# Patient Record
Sex: Female | Born: 1950 | Race: White | Hispanic: No | Marital: Married | Smoking: Current every day smoker
Health system: Southern US, Community
[De-identification: ages and names within clinical notes are randomized; demographics above are authoritative.]

## PROBLEM LIST (undated history)

## (undated) DIAGNOSIS — Z860100 Personal history of colon polyps, unspecified: Secondary | ICD-10-CM

## (undated) DIAGNOSIS — I10 Essential (primary) hypertension: Secondary | ICD-10-CM

## (undated) DIAGNOSIS — K219 Gastro-esophageal reflux disease without esophagitis: Secondary | ICD-10-CM

## (undated) HISTORY — PX: COLONOSCOPY W/ BIOPSIES AND POLYPECTOMY: SHX1376

## (undated) HISTORY — PX: BLADDER SUSPENSION: SHX72

## (undated) HISTORY — PX: ABDOMINAL HYSTERECTOMY: SHX81

---

## 2018-04-12 ENCOUNTER — Other Ambulatory Visit: Payer: Self-pay | Admitting: Family Medicine

## 2018-04-12 DIAGNOSIS — Z13820 Encounter for screening for osteoporosis: Secondary | ICD-10-CM

## 2018-05-01 ENCOUNTER — Encounter: Payer: Self-pay | Admitting: Family Medicine

## 2018-05-10 ENCOUNTER — Encounter: Payer: Self-pay | Admitting: *Deleted

## 2018-05-13 ENCOUNTER — Other Ambulatory Visit: Payer: Self-pay

## 2018-05-13 DIAGNOSIS — Z86010 Personal history of colonic polyps: Secondary | ICD-10-CM

## 2018-05-16 ENCOUNTER — Ambulatory Visit
Admission: RE | Admit: 2018-05-16 | Discharge: 2018-05-16 | Disposition: A | Discharge status: Home / Self Care | Payer: Medicare Other | Source: Ambulatory Visit | Attending: Family Medicine | Admitting: Family Medicine

## 2018-05-16 ENCOUNTER — Encounter: Payer: Self-pay | Admitting: Radiology

## 2018-05-16 DIAGNOSIS — M8588 Other specified disorders of bone density and structure, other site: Secondary | ICD-10-CM | POA: Insufficient documentation

## 2018-05-16 DIAGNOSIS — Z13820 Encounter for screening for osteoporosis: Secondary | ICD-10-CM | POA: Diagnosis present

## 2018-05-21 ENCOUNTER — Encounter
Admission: RE | Disposition: A | Discharge status: Home / Self Care | Payer: Self-pay | Source: Ambulatory Visit | Attending: Gastroenterology

## 2018-05-21 ENCOUNTER — Encounter: Payer: Self-pay | Admitting: Anesthesiology

## 2018-05-21 ENCOUNTER — Ambulatory Visit: Payer: Medicare Other | Admitting: Certified Registered Nurse Anesthetist

## 2018-05-21 ENCOUNTER — Ambulatory Visit
Admission: RE | Admit: 2018-05-21 | Discharge: 2018-05-21 | Disposition: A | Discharge status: Home / Self Care | Payer: Medicare Other | Source: Ambulatory Visit | Attending: Gastroenterology | Admitting: Gastroenterology

## 2018-05-21 ENCOUNTER — Other Ambulatory Visit: Payer: Self-pay

## 2018-05-21 DIAGNOSIS — F17200 Nicotine dependence, unspecified, uncomplicated: Secondary | ICD-10-CM | POA: Insufficient documentation

## 2018-05-21 DIAGNOSIS — D120 Benign neoplasm of cecum: Secondary | ICD-10-CM | POA: Diagnosis not present

## 2018-05-21 DIAGNOSIS — K635 Polyp of colon: Secondary | ICD-10-CM | POA: Diagnosis not present

## 2018-05-21 DIAGNOSIS — K6389 Other specified diseases of intestine: Secondary | ICD-10-CM | POA: Insufficient documentation

## 2018-05-21 DIAGNOSIS — K5730 Diverticulosis of large intestine without perforation or abscess without bleeding: Secondary | ICD-10-CM | POA: Diagnosis not present

## 2018-05-21 DIAGNOSIS — D125 Benign neoplasm of sigmoid colon: Secondary | ICD-10-CM | POA: Insufficient documentation

## 2018-05-21 DIAGNOSIS — I10 Essential (primary) hypertension: Secondary | ICD-10-CM | POA: Insufficient documentation

## 2018-05-21 DIAGNOSIS — Z79890 Hormone replacement therapy (postmenopausal): Secondary | ICD-10-CM | POA: Insufficient documentation

## 2018-05-21 DIAGNOSIS — D123 Benign neoplasm of transverse colon: Secondary | ICD-10-CM | POA: Insufficient documentation

## 2018-05-21 DIAGNOSIS — R195 Other fecal abnormalities: Secondary | ICD-10-CM | POA: Diagnosis not present

## 2018-05-21 DIAGNOSIS — D122 Benign neoplasm of ascending colon: Secondary | ICD-10-CM

## 2018-05-21 DIAGNOSIS — Z86010 Personal history of colonic polyps: Secondary | ICD-10-CM

## 2018-05-21 HISTORY — DX: Essential (primary) hypertension: I10

## 2018-05-21 HISTORY — DX: Gastro-esophageal reflux disease without esophagitis: K21.9

## 2018-05-21 HISTORY — DX: Personal history of colonic polyps: Z86.010

## 2018-05-21 HISTORY — DX: Personal history of colon polyps, unspecified: Z86.0100

## 2018-05-21 HISTORY — PX: COLONOSCOPY WITH PROPOFOL: SHX5780

## 2018-05-21 SURGERY — COLONOSCOPY WITH PROPOFOL
Anesthesia: General

## 2018-05-21 MED ORDER — VERAPAMIL HCL ER 240 MG PO TBCR: 240.0000 mg | EXTENDED_RELEASE_TABLET | Freq: Every day | ORAL | Status: DC

## 2018-05-21 MED ORDER — METOPROLOL TARTRATE 5 MG/5ML IV SOLN
INTRAVENOUS | Status: DC | PRN
  Administered 2018-05-21: 2 mg via INTRAVENOUS
  Administered 2018-05-21 (×3): 1 mg via INTRAVENOUS

## 2018-05-21 MED ORDER — PROPOFOL 10 MG/ML IV BOLUS
INTRAVENOUS | Status: AC
  Filled 2018-05-21: qty 20

## 2018-05-21 MED ORDER — LIDOCAINE HCL (CARDIAC) PF 100 MG/5ML IV SOSY
PREFILLED_SYRINGE | INTRAVENOUS | Status: DC | PRN
  Administered 2018-05-21: 50 mg via INTRAVENOUS

## 2018-05-21 MED ORDER — METOPROLOL TARTRATE 5 MG/5ML IV SOLN
INTRAVENOUS | Status: AC
  Filled 2018-05-21: qty 5

## 2018-05-21 MED ORDER — SODIUM CHLORIDE 0.9 % IV SOLN
INTRAVENOUS | Status: DC
  Administered 2018-05-21: 10:00:00 via INTRAVENOUS

## 2018-05-21 MED ORDER — PROPOFOL 500 MG/50ML IV EMUL
INTRAVENOUS | Status: DC | PRN
Start: 2018-05-21 — End: 2018-05-21
  Administered 2018-05-21: 80 ug/kg/min via INTRAVENOUS

## 2018-05-21 MED ORDER — SODIUM CHLORIDE 0.9 % IV SOLN
INTRAVENOUS | Status: DC
  Administered 2018-05-21: 11:00:00 via INTRAVENOUS

## 2018-05-21 MED ORDER — PROPOFOL 500 MG/50ML IV EMUL
INTRAVENOUS | Status: AC
Start: 2018-05-21 — End: ?
  Filled 2018-05-21: qty 50

## 2018-05-21 MED ORDER — PROPOFOL 10 MG/ML IV BOLUS
INTRAVENOUS | Status: DC | PRN
  Administered 2018-05-21: 20 mg via INTRAVENOUS
  Administered 2018-05-21: 10 mg via INTRAVENOUS

## 2018-05-21 NOTE — Care Management (Signed)
HR was 145 on arrival. 500 ml bolus given. HR then in the 120 - 130 range. During surgery noted to have AF.  12 lead obtained post op, confirming AF. Dr. Nehemiah Massed in to see new AF. Did get old EKG from [facility]. Dr Raliegh Ip ordered verapamil. Pt to see him in a few days. Thankyou for the consult.

## 2018-05-21 NOTE — Transfer of Care (Signed)
Immediate Anesthesia Transfer of Care Note  Patient: Kim Stephens  Procedure(s) Performed: COLONOSCOPY WITH PROPOFOL (N/A )  Patient Location: PACU  Anesthesia Type:MAC  Level of Consciousness: awake, alert  and oriented  Airway & Oxygen Therapy: Patient Spontanous Breathing and Patient connected to nasal cannula oxygen  Post-op Assessment: Report given to RN and Post -op Vital signs reviewed and stable  Post vital signs: stable  Last Vitals:  Vitals Value Taken Time  BP    Temp    Pulse    Resp    SpO2      Last Pain:  Vitals:   05/21/18 1020  TempSrc: Tympanic  PainSc: 0-No pain         Complications: No apparent anesthesia complications

## 2018-05-21 NOTE — Consult Note (Signed)
[facility] Consultation Note  Patient ID: Kim Stephens, MRN: [id number], DOB/AGE: 1951/07/04 67 y.o. Admit date: 05/21/2018   Date of Consult: 05/21/2018 Primary Physician: [facility] Primary Cardiologist: None  Chief Complaint: No chief complaint on file.  Reason for Consult: Atrial fibrillation  HPI: 67 y.o. female with known essential hypertension treated with appropriate medication management and also having gastroesophageal reflux symptoms with recent need in colonoscopy with removal of colon polyps.  After this occurred the patient had an episodes of atrial fibrillation with rapid ventricular rate with an EKG showing atrial fibrillation with a heart rate of 122 bpm with nonspecific ST changes.  The patient has not had any significant symptoms of this issue and feels well at this time with no heart failure or angina.  The patient has not had any other concerns but polyps removed does increase her bleeding risk.  We have discussed need for heart rate control and anticoagulation is necessary at this time  Past Medical History:  Diagnosis Date  . GERD (gastroesophageal reflux disease)   . History of colon polyps   . Hypertension       Surgical History:  Past Surgical History:  Procedure Laterality Date  . ABDOMINAL HYSTERECTOMY    . BLADDER SUSPENSION    . COLONOSCOPY W/ BIOPSIES AND POLYPECTOMY       Home Meds: Prior to Admission medications   Medication Sig Start Date End Date Taking? Authorizing Provider  b complex vitamins capsule Take 1 capsule by mouth daily.   Yes [provider]  cholecalciferol (VITAMIN D) 400 units TABS tablet Take 400 Units by mouth.   Yes [provider]  estradiol (CLIMARA - DOSED IN MG/24 HR) 0.05 mg/24hr patch Place 0.05 mg onto the skin once a week.   Yes [provider]  Javier Docker Oil 1000 MG CAPS Take by mouth.   Yes [provider]  omeprazole (PRILOSEC) 20 MG capsule Take  20 mg by mouth daily.   Yes [provider]  verapamil (CALAN-SR) 240 MG CR tablet Take 240 mg by mouth at bedtime.   Yes [provider]  vitamin C (ASCORBIC ACID) 500 MG tablet Take 500 mg by mouth daily.   Yes [provider]    Inpatient Medications:  . verapamil  240 mg Oral Daily   . sodium chloride    . sodium chloride 20 mL/hr at 05/21/18 1031    Allergies: No Known Allergies  Social History   Socioeconomic History  . Marital status: Married    Spouse name: Not on file  . Number of children: Not on file  . Years of education: Not on file  . Highest education level: Not on file  Occupational History  . Not on file  Social Needs  . Financial resource strain: Not on file  . Food insecurity:    Worry: Not on file    Inability: Not on file  . Transportation needs:    Medical: Not on file    Non-medical: Not on file  Tobacco Use  . Smoking status: Current Every Day Smoker    Packs/day: 1.00  . Smokeless tobacco: Never Used  Substance and Sexual Activity  . Alcohol use: Never    Frequency: Never  . Drug use: Never  . Sexual activity: Not on file  Lifestyle  . Physical activity:    Days per week: Not on file    Minutes per session: Not on file  . Stress: Not on  file  Relationships  . Social connections:    Talks on phone: Not on file    Gets together: Not on file    Attends religious service: Not on file    Active member of club or organization: Not on file    Attends meetings of clubs or organizations: Not on file    Relationship status: Not on file  . Intimate partner violence:    Fear of current or ex partner: Not on file    Emotionally abused: Not on file    Physically abused: Not on file    Forced sexual activity: Not on file  Other Topics Concern  . Not on file  Social History Narrative  . Not on file     History reviewed. No pertinent family history.   Review of Systems Positive for none Negative for: General:   chills, fever, night sweats or weight changes.  Cardiovascular: PND orthopnea syncope dizziness  Dermatological skin lesions rashes Respiratory: Cough congestion Urologic: Frequent urination urination at night and hematuria Abdominal: negative for nausea, vomiting, diarrhea, bright red blood per rectum, melena, or hematemesis Neurologic: negative for visual changes, and/or hearing changes  All other systems reviewed and are otherwise negative except as noted above.  Labs: No results for input(s): CKTOTAL, CKMB, TROPONINI in the last 72 hours. No results found for: WBC, HGB, HCT, MCV, PLT No results for input(s): NA, K, CL, CO2, BUN, CREATININE, CALCIUM, PROT, BILITOT, ALKPHOS, ALT, AST, GLUCOSE in the last 168 hours.  Invalid input(s): LABALBU No results found for: CHOL, HDL, LDLCALC, TRIG No results found for: DDIMER  Radiology/Studies:  Dg Bone Density (dxa)  Result Date: 05/16/2018 EXAM: DUAL X-RAY ABSORPTIOMETRY (DXA) FOR BONE MINERAL DENSITY IMPRESSION: Dear Dr. Sharrell Ku, Your patient Azka Steger completed a BMD test on 05/16/2018 using the [place] (analysis version: 14.10) manufactured by [employer]. The following summarizes the results of our evaluation. PATIENT BIOGRAPHICAL: Name: Kim Stephens Patient ID: [id number] Birth Date: [birth year] Height: 67.0 in. Gender: Female Exam Date: 05/16/2018 Weight: 174.0 lbs. Indications: Hx of Tobacco Use, Hysterectomy, Oophorectomy Bilateral, Postmenopausal Fractures: Treatments: Estradiol, Prilosec, Vitamin D ASSESSMENT: The BMD measured at AP Spine L1-L4 is 1.055 g/cm2 with a T-score of -1.1. This patient is considered osteopenic according to [facility]) criteria. Patient is not a candidate for FRAX due to taking estradiol. Site Region Measured Measured WHO Young Adult BMD Date       Age      Classification T-score AP Spine L1-L4 05/16/2018 [age] Osteopenia -1.1 1.055 g/cm2 DualFemur Total Left 05/16/2018  66.2 Normal -0.8 0.905 g/cm2 World Health Organization [facility]) criteria for post-menopausal, Caucasian Women: Normal:       T-score at or above -1 SD Osteopenia:   T-score between -1 and -2.5 SD Osteoporosis: T-score at or below -2.5 SD RECOMMENDATIONS: 1. All patients should optimize calcium and vitamin D intake. 2. Consider FDA-approved medical therapies in postmenopausal women and men aged 40 years and older, based on the following: a. A hip or vertebral(clinical or morphometric) fracture b. T-score < -2.5 at the femoral neck or spine after appropriate evaluation to exclude secondary causes c. Low bone mass (T-score between -1.0 and -2.5 at the femoral neck or spine) and a 10-year probability of a hip fracture > 3% or a 10-year probability of a major osteoporosis-related fracture > 20% based on the [place]-adapted WHO algorithm d. Clinician judgment and/or patient preferences may indicate treatment for people with 10-year fracture probabilities above or below  these levels FOLLOW-UP: People with diagnosed cases of osteoporosis or at high risk for fracture should have regular bone mineral density tests. For patients eligible for Medicare, routine testing is allowed once every 2 years. The testing frequency can be increased to one year for patients who have rapidly progressing disease, those who are receiving or discontinuing medical therapy to restore bone mass, or have additional risk factors. I have reviewed this report, and agree with the above findings. [facility] Radiology Electronically Signed   By: Lowella Grip III M.D.   On: 05/16/2018 14:08    EKG: Atrial fibrillation rapid ventricular rate nonspecific ST changes  Weights: Filed Weights   05/21/18 1020  Weight: 173 lb (78.5 kg)     Physical Exam: Blood pressure 126/71, pulse (!) 124, temperature (!) 97 F (36.1 C), temperature source Tympanic, resp. rate 14, height 5\' 7"  (1.702 m), weight 173 lb (78.5 kg), SpO2 97 %. Body mass index is 27.1  kg/m. General: Well developed, well nourished, in no acute distress. Head eyes ears nose throat: Normocephalic, atraumatic, sclera non-icteric, no xanthomas, nares are without discharge. No apparent thyromegaly and/or mass  Lungs: Normal respiratory effort.  no wheezes, no rales, no rhonchi.  Heart: Irregular with normal S1 S2. no murmur gallop, no rub, PMI is normal size and placement, carotid upstroke normal without bruit, jugular venous pressure is normal Abdomen: Soft, non-tender, non-distended with normoactive bowel sounds. No hepatomegaly. No rebound/guarding. No obvious abdominal masses. Abdominal aorta is normal size without bruit Extremities: No edema. no cyanosis, no clubbing, no ulcers  Peripheral : 2+ bilateral upper extremity pulses, 2+ bilateral femoral pulses, 2+ bilateral dorsal pedal pulse Neuro: Alert and oriented. No facial asymmetry. No focal deficit. Moves all extremities spontaneously. Musculoskeletal: Normal muscle tone without kyphosis Psych:  Responds to questions appropriately with a normal affect.    Assessment: 67 year old female with paroxysmal nonvalvular atrial fibrillation essential hypertension now without symptoms of atrial fibrillation but needing further medical management  Plan: 1.  Verapamil SR 240 mg for hypertension control and heart rate control of atrial fibrillation and possible spontaneous conversion to normal sinus rhythm 2.  Begin ambulation after above and follow for improvements of symptoms of heart rate control 3 no anticoagulation at this time due to multiple polyps being removed and concerns for significant bleeding complications thereafter 4.  Ambulating well okay for discharged home with follow-up in 1 to 2days for further adjustments of medication management  Signed, Corey Skains M.D. [facility] 05/21/2018, 1:11 PM

## 2018-05-21 NOTE — Care Management (Signed)
HR now in the 97 -110 range. OK for her to go home.

## 2018-05-21 NOTE — Anesthesia Preprocedure Evaluation (Addendum)
Anesthesia Evaluation  Patient identified by MRN, date of birth, ID band Patient awake    Reviewed: Allergy & Precautions, NPO status , Patient's Chart, lab work & pertinent test results, reviewed documented beta blocker date and time   Airway Mallampati: II  TM Distance: >3 FB     Dental  (+) Chipped, Partial Lower, Partial Upper   Pulmonary Current Smoker,           Cardiovascular hypertension,      Neuro/Psych    GI/Hepatic   Endo/Other    Renal/GU      Musculoskeletal   Abdominal   Peds  Hematology   Anesthesia Other Findings   Reproductive/Obstetrics                            Anesthesia Physical Anesthesia Plan  ASA: II  Anesthesia Plan: General   Post-op Pain Management:    Induction: Intravenous  PONV Risk Score and Plan:   Airway Management Planned:   Additional Equipment:   Intra-op Plan:   Post-operative Plan:   Informed Consent: I have reviewed the patients History and Physical, chart, labs and discussed the procedure including the risks, benefits and alternatives for the proposed anesthesia with the patient or authorized representative who has indicated his/her understanding and acceptance.     Plan Discussed with: CRNA  Anesthesia Plan Comments:         Anesthesia Quick Evaluation

## 2018-05-21 NOTE — H&P (Signed)
Lucilla Lame, MD [facility] [address] [place], [place] [address] Phone:[phone] Fax : [phone]  Primary Care Physician:  [facility] Primary Gastroenterologist:  Dr. Allen Norris  Pre-Procedure History & Physical: HPI:  Alegria Dominique is a 67 y.o. female is here for an colonoscopy.   History reviewed. No pertinent past medical history.  History reviewed. No pertinent surgical history.  Prior to Admission medications   Medication Sig Start Date End Date Taking? Authorizing Provider  b complex vitamins capsule Take 1 capsule by mouth daily.   Yes [provider]  cholecalciferol (VITAMIN D) 400 units TABS tablet Take 400 Units by mouth.   Yes [provider]  estradiol (CLIMARA - DOSED IN MG/24 HR) 0.05 mg/24hr patch Place 0.05 mg onto the skin once a week.   Yes [provider]  Javier Docker Oil 1000 MG CAPS Take by mouth.   Yes [provider]  omeprazole (PRILOSEC) 20 MG capsule Take 20 mg by mouth daily.   Yes [provider]  verapamil (CALAN-SR) 240 MG CR tablet Take 240 mg by mouth at bedtime.   Yes [provider]  vitamin C (ASCORBIC ACID) 500 MG tablet Take 500 mg by mouth daily.   Yes [provider]    Allergies as of 05/13/2018  . (Not on File)    History reviewed. No pertinent family history.  Social History   Socioeconomic History  . Marital status: Married    Spouse name: Not on file  . Number of children: Not on file  . Years of education: Not on file  . Highest education level: Not on file  Occupational History  . Not on file  Social Needs  . Financial resource strain: Not on file  . Food insecurity:    Worry: Not on file    Inability: Not on file  . Transportation needs:    Medical: Not on file    Non-medical: Not on file  Tobacco Use  . Smoking status: Not on file  Substance and Sexual Activity  . Alcohol use: Not on file  . Drug use: Not on file  . Sexual  activity: Not on file  Lifestyle  . Physical activity:    Days per week: Not on file    Minutes per session: Not on file  . Stress: Not on file  Relationships  . Social connections:    Talks on phone: Not on file    Gets together: Not on file    Attends religious service: Not on file    Active member of club or organization: Not on file    Attends meetings of clubs or organizations: Not on file    Relationship status: Not on file  . Intimate partner violence:    Fear of current or ex partner: Not on file    Emotionally abused: Not on file    Physically abused: Not on file    Forced sexual activity: Not on file  Other Topics Concern  . Not on file  Social History Narrative  . Not on file    Review of Systems: See HPI, otherwise negative ROS  Physical Exam: There were no vitals taken for this visit. General:   Alert,  pleasant and cooperative in NAD Head:  Normocephalic and atraumatic. Neck:  Supple; no masses or thyromegaly. Lungs:  Clear throughout to auscultation.    Heart:  Regular rate and rhythm. Abdomen:  Soft, nontender and nondistended. Normal bowel sounds, without guarding, and without  rebound.   Neurologic:  Alert and  oriented x4;  grossly normal neurologically.  Impression/Plan: Maraki Macquarrie is here for an colonoscopy to be performed for heme positive stools  Risks, benefits, limitations, and alternatives regarding  colonoscopy have been reviewed with the patient.  Questions have been answered.  All parties agreeable.   Lucilla Lame, MD  05/21/2018, 10:08 AM

## 2018-05-21 NOTE — Anesthesia Postprocedure Evaluation (Signed)
Anesthesia Post Note  Patient: Kim Stephens  Procedure(s) Performed: COLONOSCOPY WITH PROPOFOL (N/A )  Patient location during evaluation: Endoscopy Anesthesia Type: General Level of consciousness: awake and alert Pain management: pain level controlled Vital Signs Assessment: post-procedure vital signs reviewed and stable Respiratory status: spontaneous breathing, nonlabored ventilation, respiratory function stable and patient connected to nasal cannula oxygen Cardiovascular status: blood pressure returned to baseline and stable Postop Assessment: no apparent nausea or vomiting Anesthetic complications: no     Last Vitals:  Vitals:   05/21/18 1300 05/21/18 1330  BP: 126/71 118/90  Pulse: (!) 124 (!) 105  Resp: 14 18  Temp:    SpO2: 97% 99%    Last Pain:  Vitals:   05/21/18 1330  TempSrc:   PainSc: 0-No pain                 Aroush Chasse S

## 2018-05-21 NOTE — Anesthesia Post-op Follow-up Note (Signed)
Anesthesia QCDR form completed.        

## 2018-05-21 NOTE — Op Note (Signed)
[facility] Gastroenterology Patient Name: Sadeen Wiegel Procedure Date: 05/21/2018 10:41 AM MRN: [id number] Account #: [id number] Date of Birth: September 21, 1951 Admit Type: Outpatient Age: 67 Room: [facility] ENDO ROOM 4 Gender: Female Note Status: Finalized Procedure:            Colonoscopy Indications:          Heme positive stool Providers:            Lucilla Lame MD, MD Referring MD:         [place], MD (Referring MD) Medicines:            Propofol per Anesthesia Complications:        No immediate complications. Procedure:            Pre-Anesthesia Assessment:                       - Prior to the procedure, a History and Physical was                        performed, and patient medications and allergies were                        reviewed. The patient's tolerance of previous                        anesthesia was also reviewed. The risks and benefits of                        the procedure and the sedation options and risks were                        discussed with the patient. All questions were                        answered, and informed consent was obtained. Prior                        Anticoagulants: The patient has taken no previous                        anticoagulant or antiplatelet agents. ASA Grade                        Assessment: II - A patient with mild systemic disease.                        After reviewing the risks and benefits, the patient was                        deemed in satisfactory condition to undergo the                        procedure.                       After obtaining informed consent, the colonoscope was                        passed under direct vision. Throughout the procedure,  the patient's blood pressure, pulse, and oxygen                        saturations were monitored continuously. The                        Colonoscope was introduced through the anus and             advanced to the the cecum, identified by appendiceal                        orifice and ileocecal valve. The colonoscopy was                        performed without difficulty. The patient tolerated the                        procedure well. The quality of the bowel preparation                        was excellent. Findings:      The perianal and digital rectal examinations were normal.      A 2 mm polyp was found in the cecum. The polyp was sessile. The polyp       was removed with a cold biopsy forceps. Resection and retrieval were       complete.      Two sessile polyps were found in the ascending colon. The polyps were 3       to 5 mm in size. These polyps were removed with a cold snare. Resection       and retrieval were complete.      A 4 mm polyp was found in the transverse colon. The polyp was sessile.       The polyp was removed with a cold snare. Resection and retrieval were       complete.      A 3 mm polyp was found in the transverse colon. The polyp was sessile.       The polyp was removed with a cold biopsy forceps. Resection and       retrieval were complete.      Three sessile polyps were found in the sigmoid colon. The polyps were 2       to 4 mm in size. These polyps were removed with a cold snare. Resection       and retrieval were complete.      A 9 mm polyp was found in the sigmoid colon. The polyp was pedunculated.       The polyp was removed with a hot snare. Resection and retrieval were       complete.      Multiple small-mouthed diverticula were found in the sigmoid colon. Impression:           - One 2 mm polyp in the cecum, removed with a cold                        biopsy forceps. Resected and retrieved.                       - Two 3 to 5 mm polyps in the ascending colon, removed  with a cold snare. Resected and retrieved.                       - One 4 mm polyp in the transverse colon, removed with                        a  cold snare. Resected and retrieved.                       - One 3 mm polyp in the transverse colon, removed with                        a cold biopsy forceps. Resected and retrieved.                       - Three 2 to 4 mm polyps in the sigmoid colon, removed                        with a cold snare. Resected and retrieved.                       - One 9 mm polyp in the sigmoid colon, removed with a                        hot snare. Resected and retrieved.                       - Diverticulosis in the sigmoid colon. Recommendation:       - Discharge patient to home.                       - Resume previous diet.                       - Continue present medications.                       - Await pathology results.                       - Repeat colonoscopy in 1 year for surveillance. Procedure Code(s):    --- Professional ---                       [phone], Colonoscopy, flexible; with removal of tumor(s),                        polyp(s), or other lesion(s) by snare technique                       45380, 63, Colonoscopy, flexible; with biopsy, single                        or multiple Diagnosis Code(s):    --- Professional ---                       R19.5, Other fecal abnormalities                       D12.5, Benign neoplasm of sigmoid colon  D12.2, Benign neoplasm of ascending colon                       D12.3, Benign neoplasm of transverse colon (hepatic                        flexure or splenic flexure)                       D12.0, Benign neoplasm of cecum CPT copyright 2017 American Medical Association. All rights reserved. The codes documented in this report are preliminary and upon coder review may  be revised to meet current compliance requirements. Lucilla Lame MD, MD 05/21/2018 11:20:56 AM This report has been signed electronically. Number of Addenda: 0 Note Initiated On: 05/21/2018 10:41 AM Scope Withdrawal Time: 0 hours 21 minutes 8 seconds  Total Procedure  Duration: 0 hours 30 minutes 23 seconds       [facility]

## 2018-05-22 ENCOUNTER — Encounter: Payer: Self-pay | Admitting: Gastroenterology

## 2018-05-23 LAB — SURGICAL PATHOLOGY

## 2018-06-03 ENCOUNTER — Telehealth: Payer: Self-pay

## 2018-06-03 NOTE — Telephone Encounter (Signed)
-----   Message from Lucilla Lame, MD sent at 05/27/2018  8:40 AM EDT ----- Please have the patient come in for a follow up.

## 2018-06-03 NOTE — Telephone Encounter (Signed)
Left vm for pt to return my call to schedule follow up appt to discuss results.

## 2018-06-11 ENCOUNTER — Telehealth: Payer: Self-pay | Admitting: Gastroenterology

## 2018-06-11 NOTE — Telephone Encounter (Signed)
Faxed copy of procedure and pathology to Attn: Ranae Plumber, PA/[facility] per pt request. Pt was offered appt with Dr. Allen Norris to discuss but requested her PCP to discuss with her.

## 2018-06-11 NOTE — Telephone Encounter (Signed)
Pt is returning Kim Stephens call offered apt to see Dr. Allen Norris to go over results pt would like results send to [facility] to Dr. Mikle Bosworth please

## 2020-12-13 ENCOUNTER — Other Ambulatory Visit: Payer: Self-pay | Admitting: Family Medicine

## 2020-12-13 DIAGNOSIS — Z13820 Encounter for screening for osteoporosis: Secondary | ICD-10-CM

## 2021-01-26 ENCOUNTER — Ambulatory Visit
Admission: RE | Admit: 2021-01-26 | Discharge: 2021-01-26 | Disposition: A | Discharge status: Home / Self Care | Payer: Medicare Other | Source: Ambulatory Visit | Attending: Family Medicine | Admitting: Family Medicine

## 2021-01-26 ENCOUNTER — Other Ambulatory Visit: Payer: Self-pay

## 2021-01-26 DIAGNOSIS — Z780 Asymptomatic menopausal state: Secondary | ICD-10-CM | POA: Diagnosis not present

## 2021-01-26 DIAGNOSIS — Z13820 Encounter for screening for osteoporosis: Secondary | ICD-10-CM | POA: Diagnosis not present

## 2021-01-26 DIAGNOSIS — M8589 Other specified disorders of bone density and structure, multiple sites: Secondary | ICD-10-CM | POA: Insufficient documentation
